# Patient Record
Sex: Male | Born: 1980 | Race: White | Hispanic: No | Marital: Married | Smoking: Never smoker
Health system: Southern US, Community
[De-identification: ages and names within clinical notes are randomized; demographics above are authoritative.]

---

## 2013-07-29 ENCOUNTER — Emergency Department: Payer: Self-pay | Admitting: Emergency Medicine

## 2013-07-29 LAB — CBC
HCT: 45.4 % (ref 40.0–52.0)
HGB: 16.5 g/dL (ref 13.0–18.0)
MCHC: 36.2 g/dL — ABNORMAL HIGH (ref 32.0–36.0)
MCV: 87 fL (ref 80–100)
Platelet: 237 10*3/uL (ref 150–440)
RDW: 12.4 % (ref 11.5–14.5)
WBC: 10.5 10*3/uL (ref 3.8–10.6)

## 2013-07-29 LAB — COMPREHENSIVE METABOLIC PANEL
Albumin: 4.9 g/dL (ref 3.4–5.0)
Alkaline Phosphatase: 91 U/L
Anion Gap: 14 (ref 7–16)
BUN: 12 mg/dL (ref 7–18)
Calcium, Total: 8.9 mg/dL (ref 8.5–10.1)
Chloride: 102 mmol/L (ref 98–107)
Co2: 22 mmol/L (ref 21–32)
Creatinine: 1.14 mg/dL (ref 0.60–1.30)
EGFR (African American): >60
EGFR (Non-African Amer.): >60
Osmolality: 277 (ref 275–301)
Potassium: 3.8 mmol/L (ref 3.5–5.1)
SGPT (ALT): 73 U/L (ref 12–78)

## 2013-07-29 LAB — RAPID HIV-1/2 QL/CONFIRM: HIV-1/2,Rapid Ql: NEGATIVE

## 2014-07-23 ENCOUNTER — Emergency Department: Payer: Self-pay | Admitting: Student

## 2015-07-31 ENCOUNTER — Emergency Department: Payer: Self-pay

## 2015-07-31 ENCOUNTER — Emergency Department
Admission: EM | Admit: 2015-07-31 | Discharge: 2015-07-31 | Disposition: A | Discharge status: Home / Self Care | Payer: Self-pay | Attending: Emergency Medicine | Admitting: Emergency Medicine

## 2015-07-31 DIAGNOSIS — R42 Dizziness and giddiness: Secondary | ICD-10-CM | POA: Insufficient documentation

## 2015-07-31 DIAGNOSIS — R110 Nausea: Secondary | ICD-10-CM | POA: Insufficient documentation

## 2015-07-31 DIAGNOSIS — R51 Headache: Secondary | ICD-10-CM | POA: Insufficient documentation

## 2015-07-31 DIAGNOSIS — R519 Headache, unspecified: Secondary | ICD-10-CM

## 2015-07-31 DIAGNOSIS — Z8669 Personal history of other diseases of the nervous system and sense organs: Secondary | ICD-10-CM

## 2015-07-31 DIAGNOSIS — H538 Other visual disturbances: Secondary | ICD-10-CM | POA: Insufficient documentation

## 2015-07-31 MED ORDER — BUTALBITAL-APAP-CAFFEINE 50-325-40 MG PO TABS: 1.0000 | ORAL_TABLET | Freq: Four times a day (QID) | ORAL | Status: AC | PRN

## 2015-07-31 NOTE — ED Provider Notes (Signed)
[facility] Emergency Department Provider Note  ____________________________________________  Time seen: Approximately 12:11 PM  I have reviewed the triage vital signs and the nursing notes.   HISTORY  Chief Complaint Migraine   HPI Paul Mcpherson is a 34 y.o. male is here with complaint of left-sided headache for about 10-12 days. Patient states that today it feels like a "fuzzy feeling" with some mild dizziness and intermittent nausea. He states usually when he takes the Excedrin migraine he clears up within one day. Patient states that in the past he has been diagnosed with migraines and usually takes Excedrin Migraine with relief. Patient states that he was "diagnosed" years ago with migraines but he has never had a CT scan. Patient states that this time he also has experienced visual changes in his left eye which is not unusual for his regular migraine however he notices that occasionally it affects his right eye. Right eye vision changes or intermittent and currently he is not experiencing any problems with his eye. He denies any difficulty speaking or swallowing. He denies any other medical problems. Currently he rates his pain a 3 out of 10. He denies any upper respiratory symptoms or sinus symptoms.   History reviewed. No pertinent past medical history.  There are no active problems to display for this patient.   History reviewed. No pertinent past surgical history.  Current Outpatient Rx  Name  Route  Sig  Dispense  Refill  . aspirin-acetaminophen-caffeine (EXCEDRIN MIGRAINE) 250-250-65 MG tablet   Oral   Take 2 tablets by mouth every 6 (six) hours as needed for headache.           Allergies Review of patient's allergies indicates no known allergies.  No family history on file.  Social History Social History  Substance Use Topics  . Smoking status: Never Smoker   . Smokeless tobacco: Current User  . Alcohol Use: Yes    Review of  Systems Constitutional: No fever/chills Eyes: No visual changes. ENT: No sore throat. Cardiovascular: Denies chest pain. Respiratory: Denies shortness of breath. Gastrointestinal: No abdominal pain.  No nausea, no vomiting.  No diarrhea.   Genitourinary: Negative for dysuria. Musculoskeletal: Negative for back pain. Skin: Negative for rash. Neurological: Positive for headaches, no focal weakness or numbness.  10-point ROS otherwise negative.  ____________________________________________   PHYSICAL EXAM:  VITAL SIGNS: ED Triage Vitals  Enc Vitals Group     BP 07/31/15 1144 165/109 mmHg     Pulse Rate 07/31/15 1144 72     Resp 07/31/15 1144 18     Temp 07/31/15 1144 98.6 F (37 C)     Temp Source 07/31/15 1144 Oral     SpO2 07/31/15 1144 100 %     Weight 07/31/15 1144 195 lb (88.451 kg)     Height 07/31/15 1144 5\' 8"  (1.727 m)     Head Cir --      Peak Flow --      Pain Score 07/31/15 1144 3     Pain Loc --      Pain Edu? --      Excl. in GC? --     Constitutional: Alert and oriented. Well appearing and in no acute distress. Eyes: Conjunctivae are normal. PERRL. EOMI. Head: Atraumatic. Nose: No congestion/rhinnorhea.   EACs and TMs are clear bilaterally. Mouth/Throat: Mucous membranes are moist.  Oropharynx non-erythematous. Neck: No stridor.   No cervical tenderness on palpation posteriorly. Range of motion of the neck within normal limits.  4 planes. Hematological/Lymphatic/Immunilogical: No cervical lymphadenopathy. Cardiovascular: Normal rate, regular rhythm. Grossly normal heart sounds.  Good peripheral circulation. Respiratory: Normal respiratory effort.  No retractions. Lungs CTAB. Gastrointestinal: Soft and nontender. No distention.  Musculoskeletal: Moves upper and lower extremities without any difficulty. Normal gait was noted. Neurologic:  Normal speech and language. No gross focal neurologic deficits are appreciated. No gait instability. Cranial nerves II  through XII grossly intact. Grip strength bilateral hands within normal limits. Skin:  Skin is warm, dry and intact. No rash noted. Psychiatric: Mood and affect are normal. Speech and behavior are normal.  ____________________________________________   LABS (all labs ordered are listed, but only abnormal results are displayed)  Labs Reviewed - No data to display  RADIOLOGY CT head showed no mass lesion, acute hemorrhage or midline shift per radiologist  ____________________________________________   PROCEDURES  Procedure(s) performed: None  Critical Care performed: No  ____________________________________________   INITIAL IMPRESSION / ASSESSMENT AND PLAN / ED COURSE  Pertinent labs & imaging results that were available during my care of the patient were reviewed by me and considered in my medical decision making (see chart for details).  Patient was given a prescription for Fioricet 1 or 2 every 6 hours as needed for headache. He is to follow-up with Dr. Burnett Sheng if any continued problems with headaches. He is reassured that his CT was negative. He will mention to Dr. Burnett Sheng about possible referral to neurologist for his headaches. ____________________________________________   FINAL CLINICAL IMPRESSION(S) / ED DIAGNOSES  Final diagnoses:  None      Tommi Rumps, PA-C 07/31/15 1528  Phineas Semen, MD 08/01/15 [phone]

## 2015-07-31 NOTE — ED Notes (Signed)
States he developed left side headache about 10 -12 days  Pain is describes as pressure to top of head and radiates into left side of head   Usually take OTC meds with relief  States this headache is different than other headache

## 2015-07-31 NOTE — Discharge Instructions (Signed)
General Headache Without Cause A headache is pain or discomfort felt around the head or neck area. The specific cause of a headache may not be found. There are many causes and types of headaches. A few common ones are:  Tension headaches.  Migraine headaches.  Cluster headaches.  Chronic daily headaches. HOME CARE INSTRUCTIONS  Watch your condition for any changes. Take these steps to help with your condition: Managing Pain  Take over-the-counter and prescription medicines only as told by your health care provider.  Lie down in a dark, quiet room when you have a headache.  If directed, apply ice to the head and neck area:  Put ice in a plastic bag.  Place a towel between your skin and the bag.  Leave the ice on for 20 minutes, 2-3 times per day.  Use a heating pad or hot shower to apply heat to the head and neck area as told by your health care provider.  Keep lights dim if bright lights bother you or make your headaches worse. Eating and Drinking  Eat meals on a regular schedule.  Limit alcohol use.  Decrease the amount of caffeine you drink, or stop drinking caffeine. General Instructions  Keep all follow-up visits as told by your health care provider. This is important.  Keep a headache journal to help find out what may trigger your headaches. For example, write down:  What you eat and drink.  How much sleep you get.  Any change to your diet or medicines.  Try massage or other relaxation techniques.  Limit stress.  Sit up straight, and do not tense your muscles.  Do not use tobacco products, including cigarettes, chewing tobacco, or e-cigarettes. If you need help quitting, ask your health care provider.  Exercise regularly as told by your health care provider.  Sleep on a regular schedule. Get 7-9 hours of sleep, or the amount recommended by your health care provider. SEEK MEDICAL CARE IF:   Your symptoms are not helped by medicine.  You have a  headache that is different from the usual headache.  You have nausea or you vomit.  You have a fever. SEEK IMMEDIATE MEDICAL CARE IF:   Your headache becomes severe.  You have repeated vomiting.  You have a stiff neck.  You have a loss of vision.  You have problems with speech.  You have pain in the eye or ear.  You have muscular weakness or loss of muscle control.  You lose your balance or have trouble walking.  You feel faint or pass out.  You have confusion.   This information is not intended to replace advice given to you by your health care provider. Make sure you discuss any questions you have with your health care provider.   Document Released: 07/20/2005 Document Revised: 04/10/2015 Document Reviewed: 11/12/2014 [employer]vier Interactive Patient Education [employer]   Follow-up with Dr. Terance HartBronstein or get a referral to a neurologist for your headaches. Fioricet as directed for headache. Do not drive or operate machinery while taking this medication. Increase fluids today.

## 2015-07-31 NOTE — ED Notes (Signed)
Pt reports migraines X 12 days to top of head radiates to left side of head. Pt reports pressure to left ear and peripheral vision decreased intermittently to left eye. Pt reports "fuzzy feeling to left side of head". Mild dizziness and nausea intermittently that occurs with the pain. Denies weakness. Pt alert and oriented X4, active, cooperative, pt in NAD. RR even and unlabored, color WNL.

## 2016-09-19 IMAGING — CT CT HEAD W/O CM
1 series · 16 of 30 positions shown, 20 images · non-contrast
Comparison: None.

CLINICAL DATA: Headache. Decreased peripheral vision in the left
eye. Dizziness and nausea.

EXAM:
CT HEAD WITHOUT CONTRAST
TECHNIQUE: Contiguous axial images were obtained from the base of the skull
through the vertex without intravenous contrast.

[Series 2: head wo · axial · 0.45mm/px · z∈[+384,+528]mm · 16 of 36 slices shown, 20 images]
[im 2/36  brain]
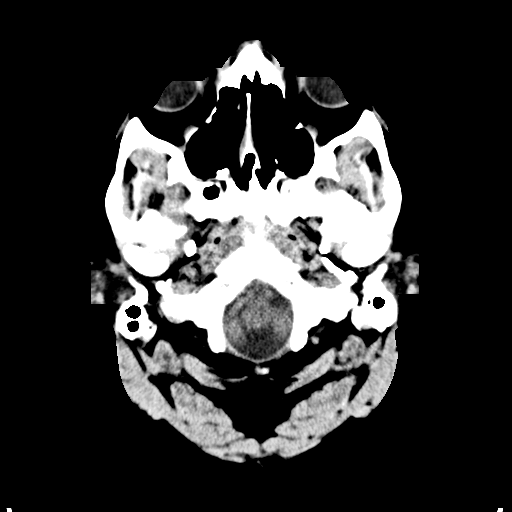
[im 2/36  bone]
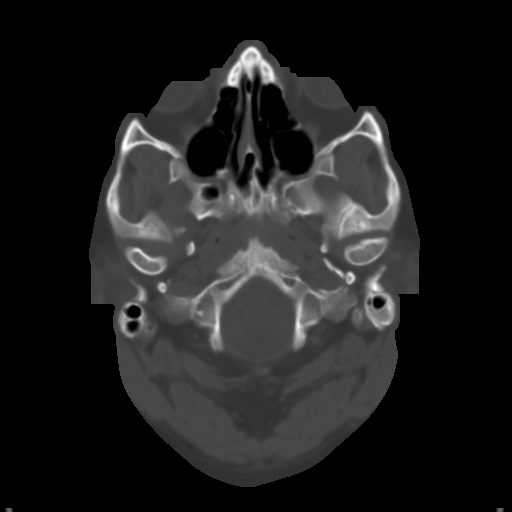
[im 4/36  brain]
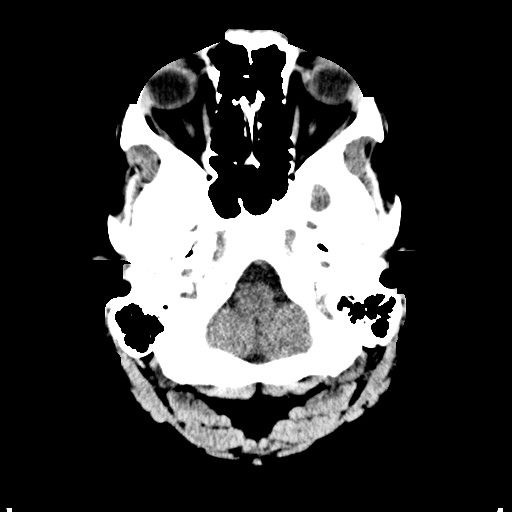
[im 7/36  brain]
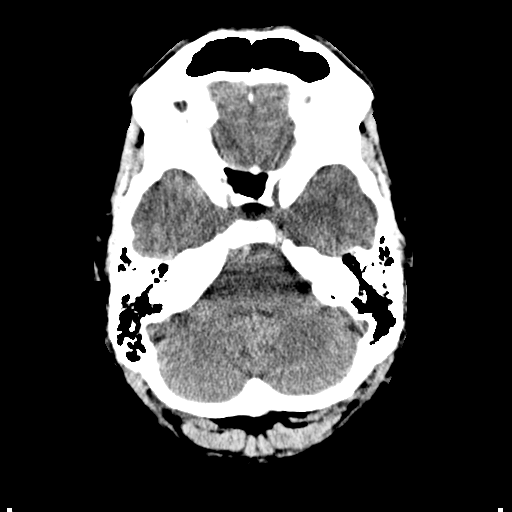
[im 9/36  brain]
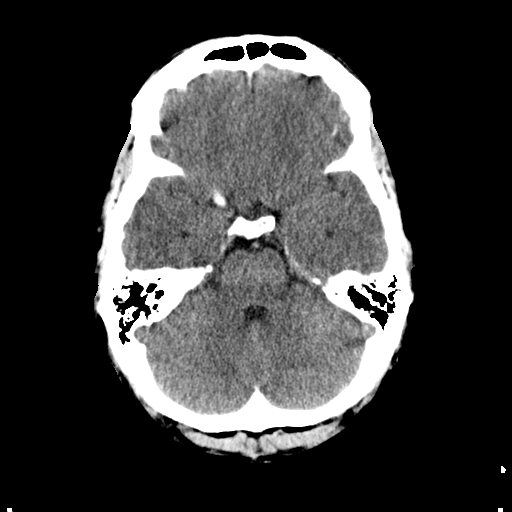
[im 10/36  brain]
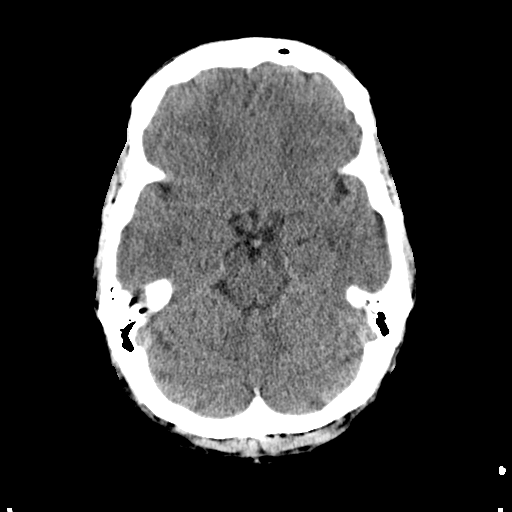
[im 10/36  bone]
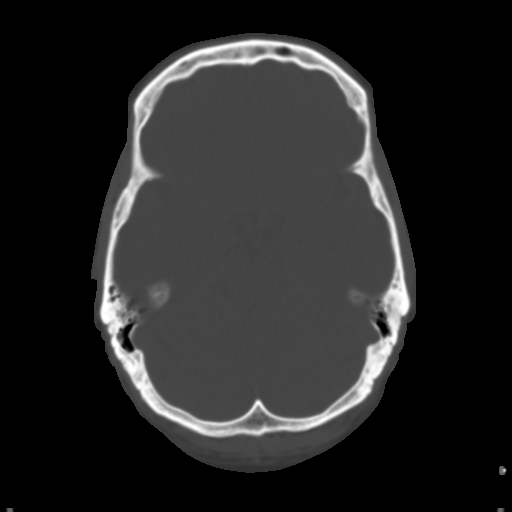
[im 13/36  brain]
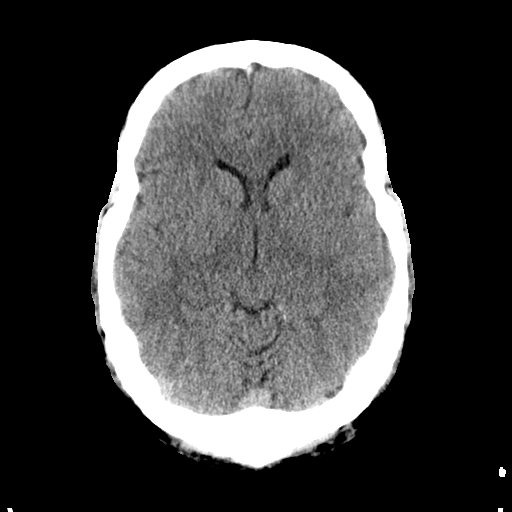
[im 15/36  brain]
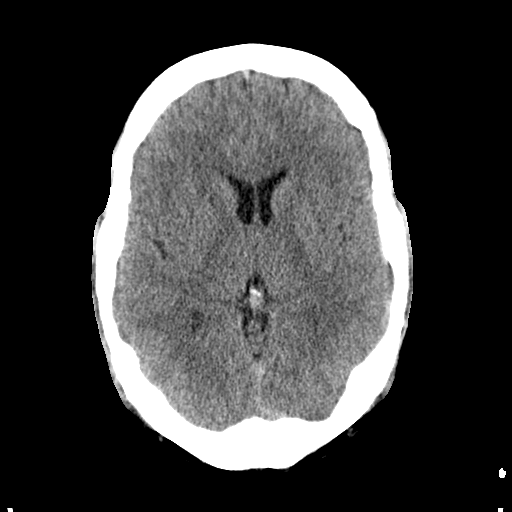
[im 17/36  brain]
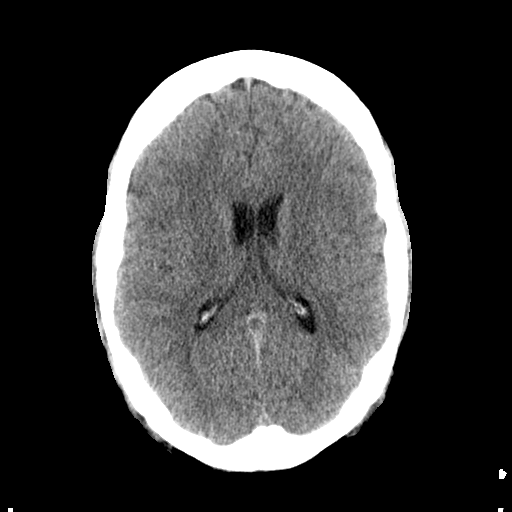
[im 19/36  brain]
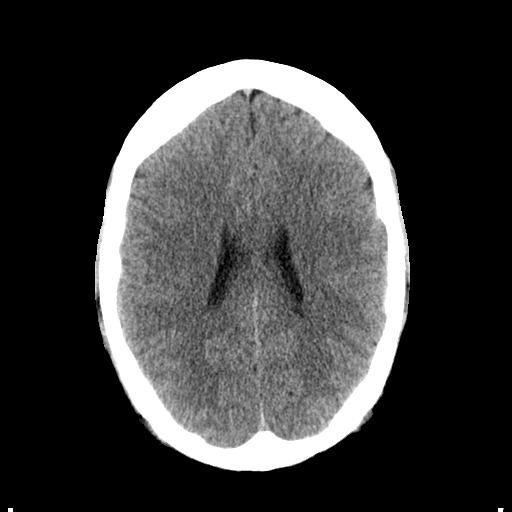
[im 19/36  bone]
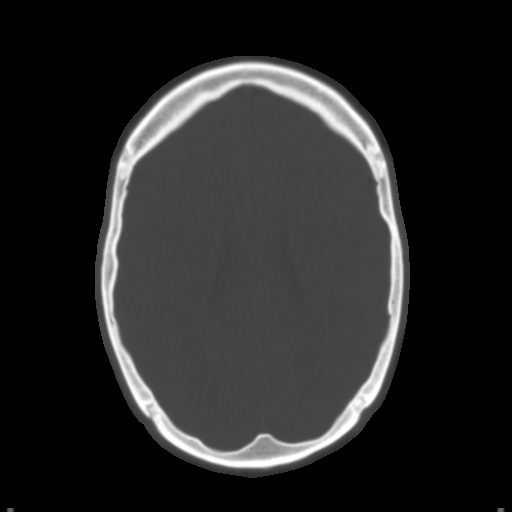
[im 21/36  brain]
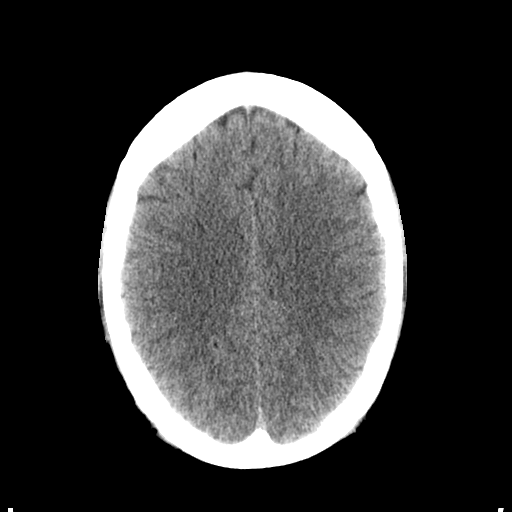
[im 23/36  brain]
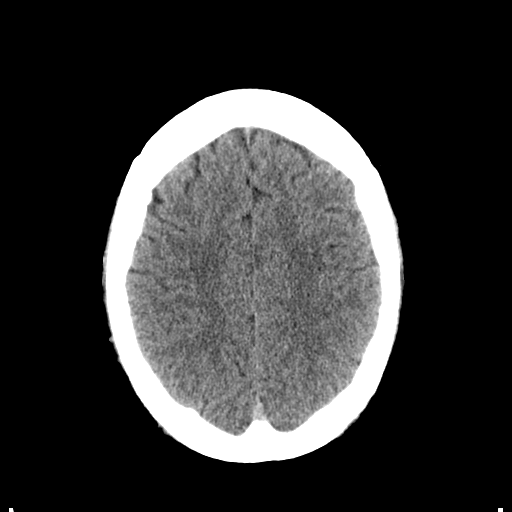
[im 26/36  brain]
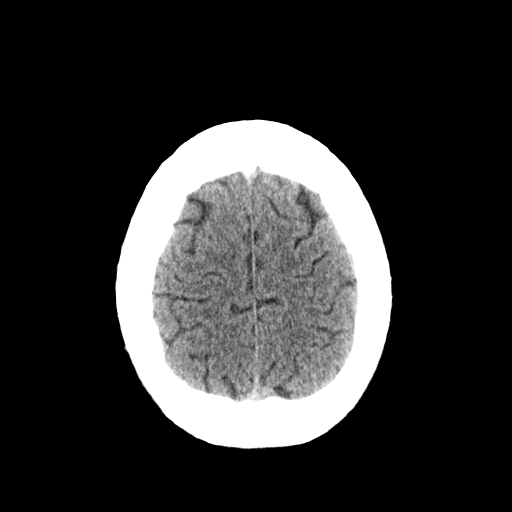
[im 27/36  brain]
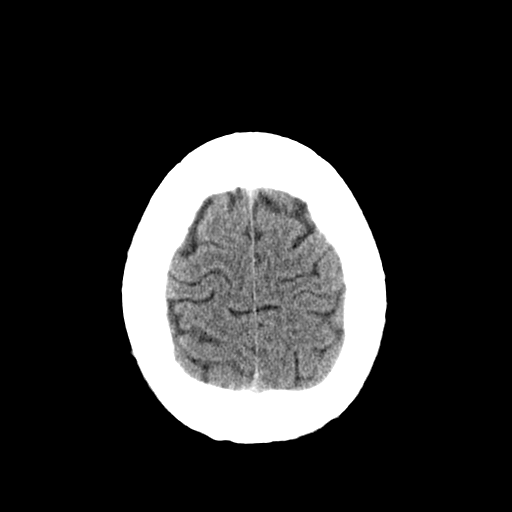
[im 27/36  bone]
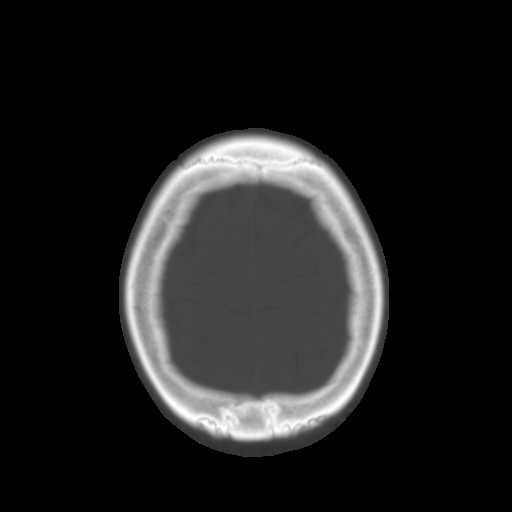
[im 29/36  brain]
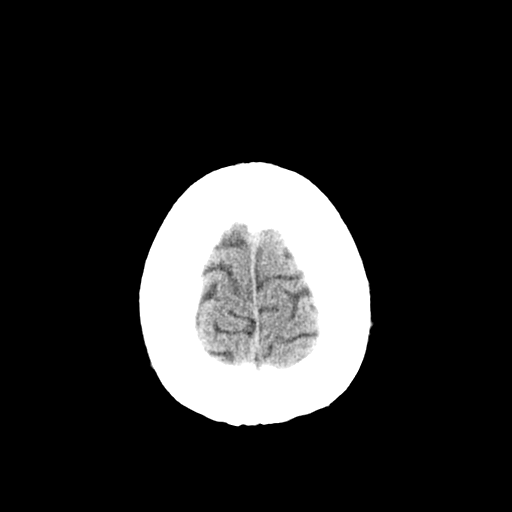
[im 32/36  brain]
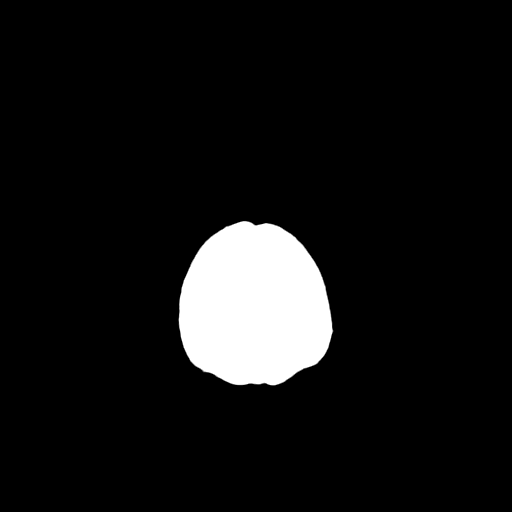
[im 34/36  brain]
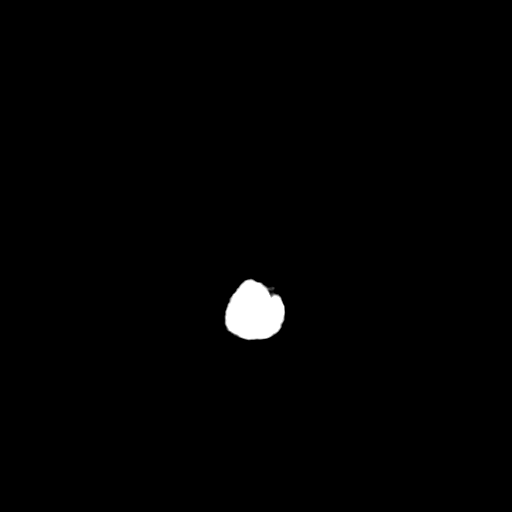

[16 of 30 positions shown; findings below may reference images not displayed]

FINDINGS: No mass lesion. No midline shift. No acute hemorrhage or hematoma.
No extra-axial fluid collections. No evidence of acute infarction.
Brain parenchyma is normal. Osseous structures are normal.
IMPRESSION: Normal exam.
